# Patient Record
Sex: Male | Born: 1985 | Race: Black or African American | Hispanic: No | Marital: Single | Smoking: Current some day smoker
Health system: Southern US, Community
[De-identification: ages and names within clinical notes are randomized; demographics above are authoritative.]

---

## 2009-07-07 ENCOUNTER — Emergency Department (HOSPITAL_COMMUNITY): Admission: EM | Admit: 2009-07-07 | Discharge: 2009-07-07 | Payer: Self-pay | Admitting: Emergency Medicine

## 2010-07-18 IMAGING — CR DG CHEST 2V
2 series · 2 of 2 positions shown · non-contrast
Comparison: None.

CLINICAL DATA: Fever.  Nausea and vomiting.  Left upper lobe rales
on physical examination.

CHEST - 2 VIEW 07/07/2009:

[w chest pa]
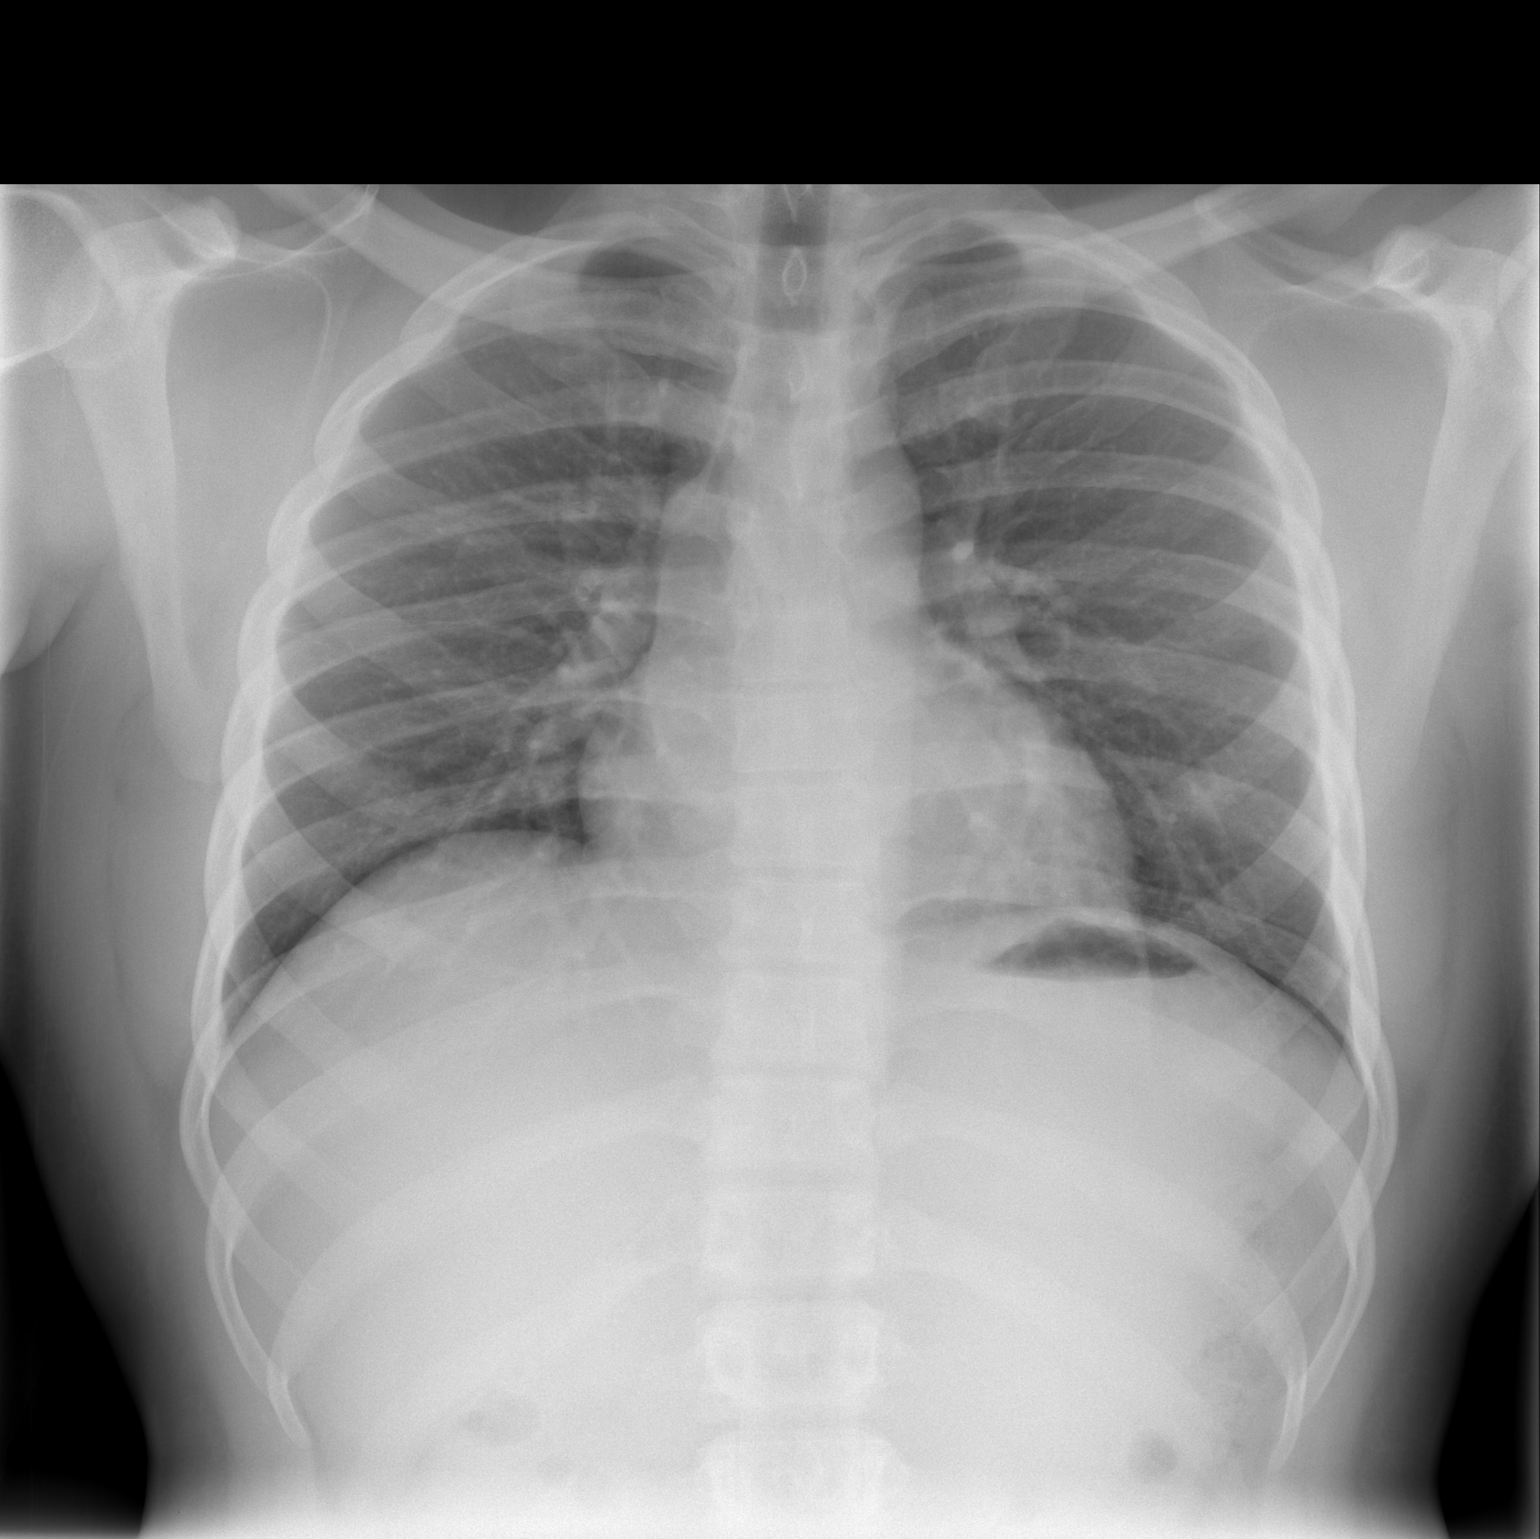

[w chest lat]
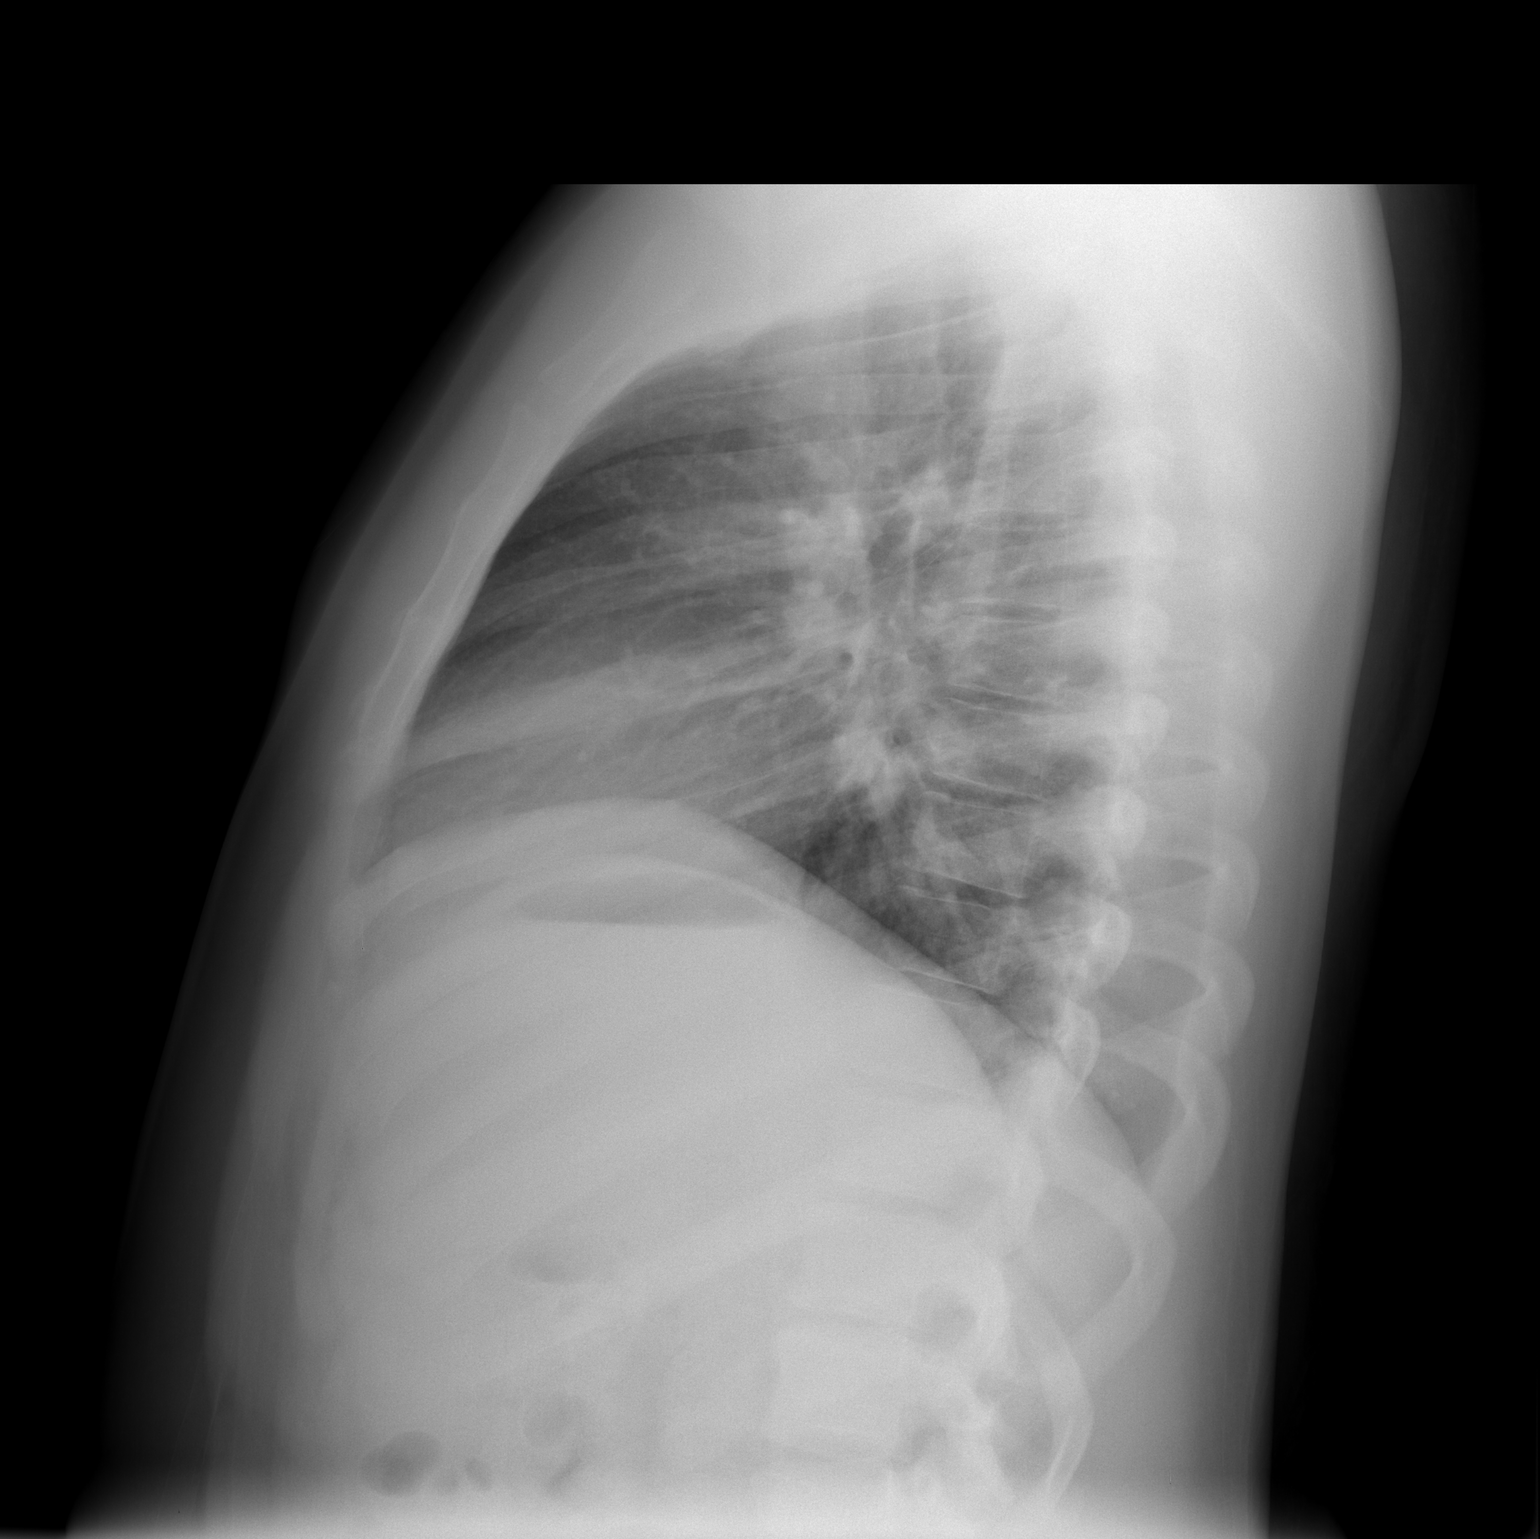

[2 of 2 positions shown; findings below may reference images not displayed]

FINDINGS: Cardiomediastinal silhouette unremarkable.  Lungs clear.
Bronchovascular markings normal.  Pulmonary vascularity normal.  No
pleural effusions.  No pneumothorax.  Visualized bony thorax
intact.
IMPRESSION: Normal chest.

## 2010-11-23 LAB — COMPREHENSIVE METABOLIC PANEL
AST: 30 U/L (ref 0–37)
Albumin: 4.5 g/dL (ref 3.5–5.2)
Calcium: 9.6 mg/dL (ref 8.4–10.5)
Chloride: 105 mEq/L (ref 96–112)
Creatinine, Ser: 1.09 mg/dL (ref 0.4–1.5)
GFR calc Af Amer: >60 mL/min (ref >60)
Total Bilirubin: 0.8 mg/dL (ref 0.3–1.2)

## 2010-11-23 LAB — URINALYSIS, ROUTINE W REFLEX MICROSCOPIC
Bilirubin Urine: NEGATIVE
Hgb urine dipstick: NEGATIVE
Specific Gravity, Urine: 1.019 (ref 1.005–1.030)
pH: 7 (ref 5.0–8.0)

## 2010-11-23 LAB — CBC
MCV: 86.3 fL (ref 78.0–100.0)
Platelets: 235 10*3/uL (ref 150–400)
WBC: 7.6 10*3/uL (ref 4.0–10.5)

## 2010-11-23 LAB — URINE MICROSCOPIC-ADD ON

## 2010-11-23 LAB — DIFFERENTIAL
Eosinophils Relative: 1 % (ref 0–5)
Lymphocytes Relative: 7 % — ABNORMAL LOW (ref 12–46)
Lymphs Abs: 0.5 10*3/uL — ABNORMAL LOW (ref 0.7–4.0)
Monocytes Absolute: 0.6 10*3/uL (ref 0.1–1.0)

## 2012-09-03 ENCOUNTER — Other Ambulatory Visit (HOSPITAL_COMMUNITY)
Admission: RE | Admit: 2012-09-03 | Discharge: 2012-09-03 | Disposition: A | Discharge status: Home / Self Care | Payer: Self-pay | Source: Ambulatory Visit | Attending: Emergency Medicine | Admitting: Emergency Medicine

## 2012-09-03 ENCOUNTER — Emergency Department (INDEPENDENT_AMBULATORY_CARE_PROVIDER_SITE_OTHER)
Admission: EM | Admit: 2012-09-03 | Discharge: 2012-09-03 | Disposition: A | Discharge status: Home / Self Care | Payer: Self-pay | Source: Home / Self Care | Attending: Emergency Medicine | Admitting: Emergency Medicine

## 2012-09-03 ENCOUNTER — Encounter (HOSPITAL_COMMUNITY): Payer: Self-pay | Admitting: *Deleted

## 2012-09-03 DIAGNOSIS — Z202 Contact with and (suspected) exposure to infections with a predominantly sexual mode of transmission: Secondary | ICD-10-CM

## 2012-09-03 DIAGNOSIS — Z113 Encounter for screening for infections with a predominantly sexual mode of transmission: Secondary | ICD-10-CM | POA: Insufficient documentation

## 2012-09-03 MED ORDER — AZITHROMYCIN 250 MG PO TABS
ORAL_TABLET | ORAL | Status: AC
  Filled 2012-09-03: qty 4

## 2012-09-03 MED ORDER — AZITHROMYCIN 250 MG PO TABS: 1000.0000 mg | ORAL_TABLET | Freq: Once | ORAL | Status: DC

## 2012-09-03 NOTE — ED Provider Notes (Signed)
Chief Complaint  Patient presents with  . Exposure to STD    History of Present Illness:   Miguel Stanley is a 27 year old male who presents because his girlfriend tested positive for Chlamydia on January 1. They have not had sexual relations since then. He had been sexually active without use of a condom. He has more than one sexual partner. He denies any symptoms such as urethral discharge, dysuria, or penile pain. He's had no penile lesions, ulcers, blisters, sores, lumps, or rash. He denies any inguinal lymphadenopathy or testicular pain or swelling. He's had no systemic symptoms such as fever, chills, headache, stiff neck, joint pain, or skin rash. He has had Chlamydia in the past. He declines testing for HIV and syphilis today.  Review of Systems:  Other than noted above, the patient denies any of the following symptoms: Systemic:  No fevers chills, aches, weight loss, arthralgias, myalgias, or adenopathy. GI:  No abdominal pain, nausea or vomiting. GU:  No dysuria, penile pain, discharge, itching, dysuria, genital lesions, testicular pain or swelling. Skin:  No rash or itching.  PMFSH:  Past medical history, family history, social history, meds, and allergies were reviewed.  Physical Exam:   Vital signs:  BP 127/85  Pulse 110  Temp 98.2 F (36.8 C) (Oral)  Resp 18  SpO2 98% Gen:  Alert, oriented, in no distress. Abdomen:  Soft and flat, non-distended, and non-tender.  No organomegaly or mass. Genital:  Normal genital examination. No urethral discharge, no penile lesions. Testes were normal and nontender without any masses. He has no hernia, no inguinal lymphadenopathy. Skin:  Warm and dry.  No rash.    Other Labs Obtained at Urgent Care Center:  DNA probes for gonorrhea, chlamydia, trichomonas were obtained.  Results are pending at this time and we will call about any positive results.  Medications given in [facility]:  He was given azithromycin 1000 mg by mouth.  Assessment:  The  encounter diagnosis was Exposure to STD.  Plan:   1.  The following meds were prescribed:   New Prescriptions   No medications on file   2.  The patient was instructed in symptomatic care and handouts were given. 3.  The patient was told to return if becoming worse in any way, if no better in 3 or 4 days, and given some red flag symptoms that would indicate earlier return. 4.  The patient was instructed to inform all sexual contacts, avoid intercourse completely for 2 weeks and then only with a condom.  The patient was told that we would call about all abnormal lab results, and that we would need to report certain kinds of infection to the health department.    Reuben Likes, MD 09/03/12 [phone]

## 2012-09-03 NOTE — ED Notes (Signed)
Pt believes that he may have been exposed to std by girlfriend - denies pain or symtpoms

## 2017-12-23 ENCOUNTER — Encounter (HOSPITAL_COMMUNITY): Payer: Self-pay | Admitting: Nurse Practitioner

## 2017-12-23 ENCOUNTER — Emergency Department (HOSPITAL_COMMUNITY)
Admission: EM | Admit: 2017-12-23 | Discharge: 2017-12-23 | Disposition: A | Discharge status: Home / Self Care | Payer: Self-pay | Attending: Emergency Medicine | Admitting: Emergency Medicine

## 2017-12-23 DIAGNOSIS — F17200 Nicotine dependence, unspecified, uncomplicated: Secondary | ICD-10-CM | POA: Insufficient documentation

## 2017-12-23 DIAGNOSIS — R350 Frequency of micturition: Secondary | ICD-10-CM

## 2017-12-23 DIAGNOSIS — R319 Hematuria, unspecified: Secondary | ICD-10-CM

## 2017-12-23 DIAGNOSIS — R300 Dysuria: Secondary | ICD-10-CM

## 2017-12-23 DIAGNOSIS — N12 Tubulo-interstitial nephritis, not specified as acute or chronic: Secondary | ICD-10-CM

## 2017-12-23 LAB — CBC
HEMATOCRIT: 41.8 % (ref 39.0–52.0)
HEMOGLOBIN: 13.7 g/dL (ref 13.0–17.0)
MCH: 27.8 pg (ref 26.0–34.0)
MCHC: 32.8 g/dL (ref 30.0–36.0)
MCV: 85 fL (ref 78.0–100.0)
Platelets: 289 10*3/uL (ref 150–400)
RBC: 4.92 MIL/uL (ref 4.22–5.81)
RDW: 14.3 % (ref 11.5–15.5)
WBC: 11.1 10*3/uL — ABNORMAL HIGH (ref 4.0–10.5)

## 2017-12-23 LAB — BASIC METABOLIC PANEL
Anion gap: 11 (ref 5–15)
BUN: 17 mg/dL (ref 6–20)
CHLORIDE: 104 mmol/L (ref 101–111)
CO2: 24 mmol/L (ref 22–32)
CREATININE: 1.24 mg/dL (ref 0.61–1.24)
Calcium: 9.1 mg/dL (ref 8.9–10.3)
GFR calc non Af Amer: >60 mL/min (ref >60)
Glucose, Bld: 95 mg/dL (ref 65–99)
POTASSIUM: 3.3 mmol/L — AB (ref 3.5–5.1)
Sodium: 139 mmol/L (ref 135–145)

## 2017-12-23 LAB — URINALYSIS, ROUTINE W REFLEX MICROSCOPIC
Glucose, UA: NEGATIVE mg/dL
Ketones, ur: NEGATIVE mg/dL
Nitrite: POSITIVE — AB
PH: 6.5 (ref 5.0–8.0)
Protein, ur: 100 mg/dL — AB
SPECIFIC GRAVITY, URINE: 1.01 (ref 1.005–1.030)

## 2017-12-23 LAB — URINALYSIS, MICROSCOPIC (REFLEX)
RBC / HPF: >50 RBC/hpf (ref 0–5)
WBC, UA: >50 WBC/hpf (ref 0–5)

## 2017-12-23 LAB — CK: CK TOTAL: 385 U/L (ref 49–397)

## 2017-12-23 MED ORDER — LEVOFLOXACIN 750 MG PO TABS
750.0000 mg | ORAL_TABLET | Freq: Once | ORAL | Status: AC
  Administered 2017-12-23: 750 mg via ORAL
  Filled 2017-12-23: qty 1

## 2017-12-23 MED ORDER — LEVOFLOXACIN 750 MG PO TABS: 750.0000 mg | ORAL_TABLET | Freq: Every day | ORAL | 0 refills | Status: AC

## 2017-12-23 NOTE — ED Triage Notes (Signed)
Pt is c/o urinary frequency associated with urgency and  mild unilateral groin pain, onset of a few hours ago. Denies dysuria or CVA/flank/lower back pain.

## 2017-12-23 NOTE — ED Provider Notes (Signed)
[facility]-EMERGENCY DEPT Provider Note   CSN: [id number] Arrival date & time: 12/23/17  1631     History   Chief Complaint Chief Complaint  Patient presents with  . Urinary Frequency  . Hematuria    HPI Miguel Stanley is a 32 y.o. male.  The history is provided by the patient and medical records. No language interpreter was used.  Hematuria  This is a new problem. The current episode started 12 to 24 hours ago. The problem occurs constantly. The problem has not changed since onset.Associated symptoms include abdominal pain. Pertinent negatives include no chest pain, no headaches and no shortness of breath. Nothing aggravates the symptoms. Nothing relieves the symptoms. He has tried nothing for the symptoms. The treatment provided no relief.    History reviewed. No pertinent past medical history.  There are no active problems to display for this patient.   History reviewed. No pertinent surgical history.      Home Medications    Prior to Admission medications   Not on File    Family History Family History  Family history unknown: Yes    Social History Social History   Tobacco Use  . Smoking status: Current Some Day Smoker  . Smokeless tobacco: Current User  Substance Use Topics  . Alcohol use: Yes    Comment: social  . Drug use: No     Allergies   Patient has no known allergies.   Review of Systems Review of Systems  Constitutional: Negative for chills, fatigue and fever.  HENT: Negative for congestion.   Respiratory: Negative for cough, chest tightness, shortness of breath and stridor.   Cardiovascular: Negative for chest pain.  Gastrointestinal: Positive for abdominal pain. Negative for constipation, diarrhea, nausea and vomiting.  Genitourinary: Positive for dysuria, flank pain (resolved), frequency and hematuria. Negative for decreased urine volume and testicular pain.  Musculoskeletal: Negative for back pain, neck pain and  neck stiffness.  Skin: Negative for rash and wound.  Neurological: Negative for light-headedness and headaches.  Psychiatric/Behavioral: Negative for agitation.  All other systems reviewed and are negative.    Physical Exam Updated Vital Signs BP (!) 139/94 (BP Location: Left Arm)   Pulse (!) 103   Temp 100 F (37.8 C) (Oral)   Resp 18   SpO2 100%   Physical Exam  Constitutional: He appears well-developed and well-nourished. No distress.  HENT:  Head: Normocephalic.  Nose: Nose normal.  Mouth/Throat: Oropharynx is clear and moist. No oropharyngeal exudate.  Eyes: Pupils are equal, round, and reactive to light. Conjunctivae and EOM are normal.  Neck: Normal range of motion.  Cardiovascular: Normal rate.  No murmur heard. Pulmonary/Chest: Effort normal. No respiratory distress. He has no wheezes. He has no rales. He exhibits no tenderness.  Abdominal: Soft. Bowel sounds are normal. He exhibits no distension. There is no tenderness.  Genitourinary:  Genitourinary Comments: Pt refused   Musculoskeletal: He exhibits no edema or tenderness.  Neurological: He is alert.  Skin: Capillary refill takes less than 2 seconds. He is not diaphoretic. No erythema. No pallor.  Nursing note and vitals reviewed.    ED Treatments / Results  Labs (all labs ordered are listed, but only abnormal results are displayed) Labs Reviewed  URINALYSIS, ROUTINE W REFLEX MICROSCOPIC - Abnormal; Notable for the following components:      Result Value   Color, Urine RED (*)    APPearance CLOUDY (*)    Hgb urine dipstick LARGE (*)    Bilirubin  Urine SMALL (*)    Protein, ur 100 (*)    Nitrite POSITIVE (*)    Leukocytes, UA LARGE (*)    All other components within normal limits  BASIC METABOLIC PANEL - Abnormal; Notable for the following components:   Potassium 3.3 (*)    All other components within normal limits  CBC - Abnormal; Notable for the following components:   WBC 11.1 (*)    All other  components within normal limits  URINALYSIS, MICROSCOPIC (REFLEX) - Abnormal; Notable for the following components:   Bacteria, UA MANY (*)    All other components within normal limits  URINE CULTURE  CK    EKG None  Radiology No results found.  Procedures Procedures (including critical care time)  Medications Ordered in ED Medications  levofloxacin (LEVAQUIN) tablet 750 mg (750 mg Oral Given 12/23/17 2004)     Initial Impression / Assessment and Plan / ED Course  I have reviewed the triage vital signs and the nursing notes.  Pertinent labs & imaging results that were available during my care of the patient were reviewed by me and considered in my medical decision making (see chart for details).     Miguel Stanley is a 32 y.o. male with no significant past medical history who presents with hematuria, urinary frequency, and dysuria.  Patient reports that he started having his urinary symptoms today.  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  He reports that he was having hematuria and urge to urinate today which she has not had in the past.  He does report that he has had dysuria with it.  He reports some lower abdominal pain when he feels like his urinate but denies any testicle or scrotal pain.  He denies any actual groin pain or penile pain.  He reports he had some fleeting left flank pain that has totally resolved.  He denies any history of kidney stones or family history of kidney stones.  He reports that he helped somebody move yesterday and was possibly dehydrated.  He denies any other complaints on arrival including no nausea vomiting, fevers, chills, chest pain, shortness breath, congestion, cough.  Next  On exam, patient had no CVA tenderness.  Lungs were clear and chest was nontender.  Abdomen nontender.  Patient deferred GU exam which was offered.  Suspect urinary tract infection versus rhabdo from dehydration  and causing urine to change.  Patient will have laboratory testing and urinalysis to further evaluate.  Given the transient pain and no discomfort at this time, doubt kidney stone however pyelonephritis is considered.       8:02 PM Patient was reassessed and he had no further flank pain.  Continue to doubt kidney stone.  Patient will be treated for urinary tract infection and possible pyelonephritis.  Patient given a dose of levofloxacin in the emergency department will be discharged with medication to complete a 5-day course of levofloxacin for pyelonephritis.  Patient will follow-up with a primary care physician and understood return precautions for new or worsened symptoms.  Patient continued to appear well otherwise.  Patient had no other questions or concerns and was discharged in good condition.   Final Clinical Impressions(s) / ED Diagnoses   Final diagnoses:  Pyelonephritis  Hematuria, unspecified type  Urinary frequency  Dysuria    ED Discharge Orders        Ordered    levofloxacin (LEVAQUIN) 750 MG tablet  Daily  12/23/17 1957      Clinical Impression: 1. Pyelonephritis   2. Hematuria, unspecified type   3. Urinary frequency   4. Dysuria     Disposition: Discharge  Condition: Good  I have discussed the results, Dx and Tx plan with the pt(& family if present). He/she/they expressed understanding and agree(s) with the plan. Discharge instructions discussed at great length. Strict return precautions discussed and pt &/or family have verbalized understanding of the instructions. No further questions at time of discharge.    New Prescriptions   LEVOFLOXACIN (LEVAQUIN) 750 MG TABLET    Take 1 tablet (750 mg total) by mouth daily for 4 days.    Follow Up: [facility] [address] [place] [address] [phone] Schedule an appointment as soon as possible for a visit    [facility]  [facility]-EMERGENCY DEPT [address] [id number] mc [place] [address] [phone]       Tegeler, Canary Brim, MD 12/23/17 2238

## 2017-12-23 NOTE — Discharge Instructions (Signed)
Your work-up today showed evidence of a urinary tract infection.  With the mild pain in your flank, I am concerned you may have a pyelonephritis instead of just a urinary tract infection.  As your pain has improved and your exam is reassuring, I do not feel you have a acute kidney stone.  Please take the antibiotics to treat the infection and follow-up with your primary doctor.  If any symptoms change or worsen or you begin developing worsened pain or cannot tolerate eating and drinking, please return to the nearest emergency department for further evaluation and management.

## 2017-12-26 LAB — URINE CULTURE: Special Requests: NORMAL

## 2017-12-27 ENCOUNTER — Telehealth: Payer: Self-pay | Admitting: *Deleted

## 2017-12-27 NOTE — Telephone Encounter (Signed)
Post ED Visit - Positive Culture Follow-up  Culture report reviewed by antimicrobial stewardship pharmacist:   Enzo Bi, Pharm.D.  Celedonio Miyamoto, Pharm.D., BCPS AQ-ID  Garvin Fila, Pharm.D., BCPS  Georgina Pillion, [address].D., BCPS  [place], [address].D., BCPS, AAHIVP  Estella Husk, Pharm.D., BCPS, AAHIVP  Lysle Pearl, PharmD, BCPS  Sherlynn Carbon, PharmD  Pollyann Samples, PharmD, BCPS  Positive urine culture Treated with Levofloxacin  PO QD x 4 days, organism sensitive to the same and no further patient follow-up is required at this time.  Virl Axe Talley 12/27/2017, 10:08 AM

## 2020-08-06 ENCOUNTER — Ambulatory Visit: Payer: Self-pay | Attending: Family

## 2020-08-06 DIAGNOSIS — Z23 Encounter for immunization: Secondary | ICD-10-CM

## 2020-12-10 NOTE — Progress Notes (Signed)
   Covid-19 Vaccination Clinic  Name:  Miguel Stanley    MRN: [id number] DOB: 08-Sep-1985  12/10/2020  Mr. Platte was observed post Covid-19 immunization for 15 minutes without incident. He was provided with Vaccine Information Sheet and instruction to access the V-Safe system.   Mr. Mcchristian was instructed to call 911 with any severe reactions post vaccine: Marland Kitchen Difficulty breathing  . Swelling of face and throat  . A fast heartbeat  . A bad rash all over body  . Dizziness and weakness   Immunizations Administered    Name Date Dose VIS Date Route   Moderna Covid-19 Booster Vaccine 08/06/2020  1:30 PM 0.25 mL 06/09/2020 Intramuscular   Manufacturer: Moderna   Lot: [id number]   NDC: 50388-828-00

## 2021-02-09 ENCOUNTER — Other Ambulatory Visit: Payer: Self-pay

## 2021-02-09 ENCOUNTER — Encounter (HOSPITAL_COMMUNITY): Payer: Self-pay

## 2021-02-09 ENCOUNTER — Ambulatory Visit (HOSPITAL_COMMUNITY)
Admission: EM | Admit: 2021-02-09 | Discharge: 2021-02-09 | Disposition: A | Discharge status: Home / Self Care | Payer: Self-pay | Attending: Emergency Medicine | Admitting: Emergency Medicine

## 2021-02-09 DIAGNOSIS — Z202 Contact with and (suspected) exposure to infections with a predominantly sexual mode of transmission: Secondary | ICD-10-CM | POA: Insufficient documentation

## 2021-02-09 LAB — POCT URINALYSIS DIPSTICK, ED / UC
Bilirubin Urine: NEGATIVE
Glucose, UA: NEGATIVE mg/dL
Ketones, ur: NEGATIVE mg/dL
Leukocytes,Ua: NEGATIVE
Nitrite: NEGATIVE
Protein, ur: NEGATIVE mg/dL
Specific Gravity, Urine: 1.025 (ref 1.005–1.030)
Urobilinogen, UA: 1 mg/dL (ref 0.0–1.0)
pH: 7 (ref 5.0–8.0)

## 2021-02-09 MED ORDER — DOXYCYCLINE HYCLATE 100 MG PO TABS: 100.0000 mg | ORAL_TABLET | Freq: Two times a day (BID) | ORAL | 0 refills | Status: DC

## 2021-02-09 NOTE — ED Triage Notes (Signed)
Pt presents for STD's test. Reports his ex girlfriend has Chlamydia. Denies penile discharge, dysuria.

## 2021-02-09 NOTE — ED Provider Notes (Signed)
[facility]  ____________________________________________  Time seen: Approximately 2:57 PM  I have reviewed the triage vital signs and the nursing notes.   HISTORY  Chief Complaint Exposure to STD    HPI Miguel Stanley is a 35 y.o. male who presents to the urgent care for complaint of exposure to chlamydia.  Patient states that his now ex-girlfriend was positive for chlamydia and recommended that he be tested.  He is currently asymptomatic with no dysuria, polyuria, hematuria.  No suprapubic pain.  No penile discharge.  Patient states that he is here for testing only.  No other complaints at this time.       History reviewed. No pertinent past medical history.  There are no problems to display for this patient.   History reviewed. No pertinent surgical history.  Prior to Admission medications   Medication Sig Start Date End Date Taking? Authorizing Provider  doxycycline (VIBRA-TABS) 100 MG tablet Take 1 tablet (100 mg total) by mouth 2 (two) times daily. 02/09/21  Yes Antwan Bribiesca, Delorise Royals, PA-C  acetaminophen (TYLENOL) 500 MG tablet Take 1,000 mg by mouth every 6 (six) hours as needed for mild pain.    [provider]    Allergies Patient has no known allergies.  Family History  Problem Relation Age of Onset   Stroke Mother    Healthy Father     Social History Social History   Tobacco Use   Smoking status: Some Days    Pack years: 0.00   Smokeless tobacco: Current  Vaping Use   Vaping Use: Some days  Substance Use Topics   Alcohol use: Yes    Comment: social   Drug use: No     Review of Systems  Constitutional: No fever/chills Eyes: No visual changes. No discharge ENT: No upper respiratory complaints. Cardiovascular: no chest pain. Respiratory: no cough. No SOB. Gastrointestinal: No abdominal pain.  No nausea, no vomiting.  No diarrhea.  No constipation. Genitourinary: Negative for dysuria. No hematuria.  Positive for exposure to  chlamydia Musculoskeletal: Negative for musculoskeletal pain. Skin: Negative for rash, abrasions, lacerations, ecchymosis. Neurological: Negative for headaches, focal weakness or numbness.  10 System ROS otherwise negative.  ____________________________________________   PHYSICAL EXAM:  VITAL SIGNS: ED Triage Vitals  Enc Vitals Group     BP 02/09/21 1412 128/85     Pulse Rate 02/09/21 1412 61     Resp 02/09/21 1412 16     Temp 02/09/21 1412 98.4 F (36.9 C)     Temp Source 02/09/21 1412 Oral     SpO2 02/09/21 1412 100 %     Weight --      Height --      Head Circumference --      Peak Flow --      Pain Score 02/09/21 1410 0     Pain Loc --      Pain Edu? --      Excl. in GC? --      Constitutional: Alert and oriented. Well appearing and in no acute distress. Eyes: Conjunctivae are normal. PERRL. EOMI. Head: Atraumatic. ENT:      Ears:       Nose: No congestion/rhinnorhea.      Mouth/Throat: Mucous membranes are moist.  Neck: No stridor.    Cardiovascular: Normal rate, regular rhythm. Normal S1 and S2.  Good peripheral circulation. Respiratory: Normal respiratory effort without tachypnea or retractions. Lungs CTAB. Good air entry to the bases with no decreased or absent breath  sounds. Gastrointestinal: Bowel sounds 4 quadrants. Soft and nontender to palpation. No guarding or rigidity. No palpable masses. No distention. No CVA tenderness. Genitourinary: Declines urogenital exam at this time Musculoskeletal: Full range of motion to all extremities. No gross deformities appreciated. Neurologic:  Normal speech and language. No gross focal neurologic deficits are appreciated.  Skin:  Skin is warm, dry and intact. No rash noted. Psychiatric: Mood and affect are normal. Speech and behavior are normal. Patient exhibits appropriate insight and judgement.   ____________________________________________   LABS (all labs ordered are listed, but only abnormal results are  displayed)  Labs Reviewed  POCT URINALYSIS DIPSTICK, ED / UC  URINE CYTOLOGY ANCILLARY ONLY   ____________________________________________  EKG   ____________________________________________  RADIOLOGY   No results found.  ____________________________________________    PROCEDURES  Procedure(s) performed:    Procedures    Medications - No data to display   ____________________________________________   INITIAL IMPRESSION / ASSESSMENT AND PLAN / ED COURSE  Pertinent labs & imaging results that were available during my care of the patient were reviewed by me and considered in my medical decision making (see chart for details).  Review of the [place] CSRS was performed in accordance of the [facility] prior to dispensing any controlled drugs.           Patient's diagnosis is consistent with exposure to chlamydia.  Patient presents to urgent care with a complaint of being exposed to chlamydia.  Patient is asymptomatic at this time but requesting testing.  I discussed the options of empiric treatment versus waiting for results.  Patient would prefer to wait for results at this time.  Testing will be sent, will prescribe doxycycline should the test returned positive.  If the patient has gonorrhea he will have to return for IM injection.  Patient is agreeable with this plan..  Patient is given ED precautions to return to the ED for any worsening or new symptoms.     ____________________________________________  FINAL CLINICAL IMPRESSION(S) / DIAGNOSES  Final diagnoses:  STD exposure      NEW MEDICATIONS STARTED DURING THIS VISIT:  ED Discharge Orders          Ordered    doxycycline (VIBRA-TABS) 100 MG tablet  2 times daily        02/09/21 1516                This chart was dictated using voice recognition software/Dragon. Despite best efforts to proofread, errors can occur which can change the meaning. Any change was purely unintentional.    Racheal Patches, PA-C 02/09/21 1517

## 2021-02-10 LAB — URINE CYTOLOGY ANCILLARY ONLY
Chlamydia: POSITIVE — AB
Comment: NEGATIVE
Comment: NORMAL
Neisseria Gonorrhea: NEGATIVE

## 2021-06-29 ENCOUNTER — Other Ambulatory Visit: Payer: Self-pay

## 2021-06-29 ENCOUNTER — Ambulatory Visit (HOSPITAL_COMMUNITY)
Admission: EM | Admit: 2021-06-29 | Discharge: 2021-06-29 | Disposition: A | Discharge status: Home / Self Care | Payer: Self-pay | Attending: Physician Assistant | Admitting: Physician Assistant

## 2021-06-29 ENCOUNTER — Encounter (HOSPITAL_COMMUNITY): Payer: Self-pay

## 2021-06-29 DIAGNOSIS — Z202 Contact with and (suspected) exposure to infections with a predominantly sexual mode of transmission: Secondary | ICD-10-CM | POA: Insufficient documentation

## 2021-06-29 DIAGNOSIS — Z711 Person with feared health complaint in whom no diagnosis is made: Secondary | ICD-10-CM | POA: Insufficient documentation

## 2021-06-29 MED ORDER — DOXYCYCLINE HYCLATE 100 MG PO TABS: 100.0000 mg | ORAL_TABLET | Freq: Two times a day (BID) | ORAL | 0 refills | Status: AC

## 2021-06-29 NOTE — Discharge Instructions (Signed)
We are treating you for chlamydia.  We will contact you if we need to arrange any additional treatment based on your swab results.  Please abstain from sex until you receive results and you have completed treatment.  All partners will need to be tested and treated as well.  If you have any worsening symptoms you need to be reevaluated.

## 2021-06-29 NOTE — ED Triage Notes (Addendum)
Pt presents with complaints of exposure to chlamydia. Reports he has had chlamydia before. States one of his testicles feels funny x 2 days.

## 2021-06-29 NOTE — ED Provider Notes (Signed)
[facility]    CSN: [id number] Arrival date & time: 06/29/21  1344      History   Chief Complaint Chief Complaint  Patient presents with   Exposure to STD    HPI Miguel Stanley is a 35 y.o. male.   Patient presents today following exposure to chlamydia.  Reports that his significant other has since tested positive and he is interested in initiating treatment.  He denies any significant symptoms but does report occasional discomfort in one of his testicles.  He denies any current pain.  Reports this is similar to previous episodes of chlamydia.  He was prescribed doxycycline in June 2022 for similar symptoms and reports taking most of the medicine but missing the last several doses.  He denies any dysuria, abdominal pain, pelvic pain, fever, nausea, vomiting, penile discharge.  Denies additional antibiotic use since doxycycline in June 2022.  No additional complaints or concerns today.   History reviewed. No pertinent past medical history.  There are no problems to display for this patient.   History reviewed. No pertinent surgical history.     Home Medications    Prior to Admission medications   Medication Sig Start Date End Date Taking? Authorizing Provider  acetaminophen (TYLENOL) 500 MG tablet Take 1,000 mg by mouth every 6 (six) hours as needed for mild pain.    [provider]  doxycycline (VIBRA-TABS) 100 MG tablet Take 1 tablet (100 mg total) by mouth 2 (two) times daily. 06/29/21   Jonny Longino, Noberto Retort, PA-C    Family History Family History  Problem Relation Age of Onset   Stroke Mother    Healthy Father     Social History Social History   Tobacco Use   Smoking status: Some Days   Smokeless tobacco: Current  Vaping Use   Vaping Use: Some days  Substance Use Topics   Alcohol use: Yes    Comment: social   Drug use: No     Allergies   Patient has no known allergies.   Review of Systems Review of Systems  Constitutional:  Negative for  activity change, appetite change, fatigue and fever.  Respiratory:  Negative for cough and shortness of breath.   Cardiovascular:  Negative for chest pain.  Gastrointestinal:  Negative for abdominal pain, diarrhea, nausea and vomiting.  Genitourinary:  Negative for dysuria, frequency, penile discharge, penile pain and urgency.  Neurological:  Negative for dizziness, light-headedness and headaches.    Physical Exam Triage Vital Signs ED Triage Vitals  Enc Vitals Group     BP 06/29/21 1502 123/82     Pulse Rate 06/29/21 1502 70     Resp 06/29/21 1502 19     Temp 06/29/21 1502 98.6 F (37 C)     Temp src --      SpO2 06/29/21 1502 98 %     Weight --      Height --      Head Circumference --      Peak Flow --      Pain Score 06/29/21 1501 0     Pain Loc --      Pain Edu? --      Excl. in GC? --    No data found.  Updated Vital Signs BP 123/82   Pulse 70   Temp 98.6 F (37 C)   Resp 19   SpO2 98%   Visual Acuity Right Eye Distance:   Left Eye Distance:   Bilateral Distance:  Right Eye Near:   Left Eye Near:    Bilateral Near:     Physical Exam Vitals reviewed.  Constitutional:      General: He is awake.     Appearance: Normal appearance. He is well-developed. He is not ill-appearing.     Comments: Very pleasant male appears stated age in no acute distress sitting comfortably in exam room  HENT:     Head: Normocephalic and atraumatic.  Cardiovascular:     Rate and Rhythm: Normal rate and regular rhythm.     Heart sounds: Normal heart sounds, S1 normal and S2 normal. No murmur heard. Pulmonary:     Effort: Pulmonary effort is normal.     Breath sounds: Normal breath sounds. No stridor. No wheezing, rhonchi or rales.     Comments: Clear to auscultation bilaterally Abdominal:     General: Bowel sounds are normal.     Palpations: Abdomen is soft.     Tenderness: There is no abdominal tenderness. There is no right CVA tenderness, left CVA tenderness, guarding  or rebound.     Comments: Benign abdominal exam  Genitourinary:    Comments: Exam deferred Neurological:     Mental Status: He is alert.  Psychiatric:        Behavior: Behavior is cooperative.     UC Treatments / Results  Labs (all labs ordered are listed, but only abnormal results are displayed) Labs Reviewed  CYTOLOGY, (ORAL, ANAL, URETHRAL) ANCILLARY ONLY    EKG   Radiology No results found.  Procedures Procedures (including critical care time)  Medications Ordered in UC Medications - No data to display  Initial Impression / Assessment and Plan / UC Course  I have reviewed the triage vital signs and the nursing notes.  Pertinent labs & imaging results that were available during my care of the patient were reviewed by me and considered in my medical decision making (see chart for details).     Will empirically treat for chlamydia given known exposure.  Doxycycline sent to pharmacy and patient was instructed to avoid prolonged sun exposure due to positivity associate with this medication.  Discussed the importance of completing course of medication use he is doing better sooner.  We will contact him if additional treatment is needed based on STI swab.  Offered HIV/hepatitis/RPR but he declined this today.  Discussed importance of safe sex practices.  Discussed alarm symptoms that warrant emergent evaluation.  Should return precautions given to which she expressed understanding.  Final Clinical Impressions(s) / UC Diagnoses   Final diagnoses:  Exposure to STD  Concern about STD in male without diagnosis  Exposure to chlamydia     Discharge Instructions      We are treating you for chlamydia.  We will contact you if we need to arrange any additional treatment based on your swab results.  Please abstain from sex until you receive results and you have completed treatment.  All partners will need to be tested and treated as well.  If you have any worsening symptoms you  need to be reevaluated.     ED Prescriptions     Medication Sig Dispense Auth. Provider   doxycycline (VIBRA-TABS) 100 MG tablet Take 1 tablet (100 mg total) by mouth 2 (two) times daily. 14 tablet Hyde Sires, Noberto Retort, PA-C      PDMP not reviewed this encounter.   Jeani Hawking, PA-C 06/29/21 1522

## 2021-06-30 LAB — CYTOLOGY, (ORAL, ANAL, URETHRAL) ANCILLARY ONLY
